# Patient Record
Sex: Female | Born: 1945 | Race: White | Smoking: Former smoker
Health system: Southern US, Community
[De-identification: ages and names within clinical notes are randomized; demographics above are authoritative.]

## PROBLEM LIST (undated history)

## (undated) DIAGNOSIS — Z9289 Personal history of other medical treatment: Secondary | ICD-10-CM

## (undated) DIAGNOSIS — Z9889 Other specified postprocedural states: Secondary | ICD-10-CM

## (undated) DIAGNOSIS — I10 Essential (primary) hypertension: Secondary | ICD-10-CM

## (undated) DIAGNOSIS — M8580 Other specified disorders of bone density and structure, unspecified site: Secondary | ICD-10-CM

## (undated) DIAGNOSIS — H409 Unspecified glaucoma: Secondary | ICD-10-CM

## (undated) HISTORY — DX: Other specified postprocedural states: Z98.890

## (undated) HISTORY — DX: Essential (primary) hypertension: I10

## (undated) HISTORY — PX: OTHER SURGICAL HISTORY: SHX169

## (undated) HISTORY — DX: Unspecified glaucoma: H40.9

## (undated) HISTORY — DX: Other specified disorders of bone density and structure, unspecified site: M85.80

## (undated) HISTORY — DX: Personal history of other medical treatment: Z92.89

## (undated) HISTORY — PX: CATARACT EXTRACTION: SUR2

---

## 2010-10-11 ENCOUNTER — Other Ambulatory Visit: Payer: Self-pay | Admitting: Geriatric Medicine

## 2010-10-11 DIAGNOSIS — Z1239 Encounter for other screening for malignant neoplasm of breast: Secondary | ICD-10-CM

## 2010-10-22 ENCOUNTER — Ambulatory Visit
Admission: RE | Admit: 2010-10-22 | Discharge: 2010-10-22 | Disposition: A | Discharge status: Home / Self Care | Payer: 59 | Source: Ambulatory Visit | Attending: Geriatric Medicine | Admitting: Geriatric Medicine

## 2010-10-22 DIAGNOSIS — Z1239 Encounter for other screening for malignant neoplasm of breast: Secondary | ICD-10-CM

## 2010-11-02 ENCOUNTER — Other Ambulatory Visit: Payer: Self-pay | Admitting: Geriatric Medicine

## 2010-11-02 DIAGNOSIS — R928 Other abnormal and inconclusive findings on diagnostic imaging of breast: Secondary | ICD-10-CM

## 2010-11-09 ENCOUNTER — Ambulatory Visit
Admission: RE | Admit: 2010-11-09 | Discharge: 2010-11-09 | Disposition: A | Discharge status: Home / Self Care | Payer: 59 | Source: Ambulatory Visit | Attending: Geriatric Medicine | Admitting: Geriatric Medicine

## 2010-11-09 DIAGNOSIS — R928 Other abnormal and inconclusive findings on diagnostic imaging of breast: Secondary | ICD-10-CM

## 2011-05-28 ENCOUNTER — Other Ambulatory Visit (HOSPITAL_COMMUNITY)
Admission: RE | Admit: 2011-05-28 | Discharge: 2011-05-28 | Disposition: A | Discharge status: Home / Self Care | Payer: 59 | Source: Ambulatory Visit | Attending: Geriatric Medicine | Admitting: Geriatric Medicine

## 2011-05-28 DIAGNOSIS — Z01419 Encounter for gynecological examination (general) (routine) without abnormal findings: Secondary | ICD-10-CM | POA: Insufficient documentation

## 2011-05-28 DIAGNOSIS — Z1159 Encounter for screening for other viral diseases: Secondary | ICD-10-CM | POA: Insufficient documentation

## 2012-11-11 ENCOUNTER — Other Ambulatory Visit: Payer: Self-pay

## 2012-11-11 DIAGNOSIS — Z1231 Encounter for screening mammogram for malignant neoplasm of breast: Secondary | ICD-10-CM

## 2013-01-01 ENCOUNTER — Ambulatory Visit
Admission: RE | Admit: 2013-01-01 | Discharge: 2013-01-01 | Disposition: A | Discharge status: Home / Self Care | Payer: 59 | Source: Ambulatory Visit

## 2013-01-01 ENCOUNTER — Ambulatory Visit: Payer: Self-pay

## 2013-01-01 DIAGNOSIS — Z1231 Encounter for screening mammogram for malignant neoplasm of breast: Secondary | ICD-10-CM

## 2013-01-05 ENCOUNTER — Other Ambulatory Visit: Payer: Self-pay | Admitting: Geriatric Medicine

## 2013-01-05 DIAGNOSIS — R928 Other abnormal and inconclusive findings on diagnostic imaging of breast: Secondary | ICD-10-CM

## 2013-01-20 ENCOUNTER — Ambulatory Visit
Admission: RE | Admit: 2013-01-20 | Discharge: 2013-01-20 | Disposition: A | Discharge status: Home / Self Care | Payer: 59 | Source: Ambulatory Visit | Attending: Geriatric Medicine | Admitting: Geriatric Medicine

## 2013-01-20 DIAGNOSIS — R928 Other abnormal and inconclusive findings on diagnostic imaging of breast: Secondary | ICD-10-CM

## 2014-07-15 ENCOUNTER — Other Ambulatory Visit: Payer: Self-pay

## 2014-07-15 DIAGNOSIS — Z1231 Encounter for screening mammogram for malignant neoplasm of breast: Secondary | ICD-10-CM

## 2014-09-05 ENCOUNTER — Ambulatory Visit
Admission: RE | Admit: 2014-09-05 | Discharge: 2014-09-05 | Disposition: A | Discharge status: Home / Self Care | Payer: Medicare Other | Source: Ambulatory Visit

## 2014-09-05 DIAGNOSIS — Z1231 Encounter for screening mammogram for malignant neoplasm of breast: Secondary | ICD-10-CM

## 2014-09-08 ENCOUNTER — Other Ambulatory Visit: Payer: Self-pay | Admitting: Geriatric Medicine

## 2014-09-08 DIAGNOSIS — R928 Other abnormal and inconclusive findings on diagnostic imaging of breast: Secondary | ICD-10-CM

## 2014-09-16 ENCOUNTER — Ambulatory Visit
Admission: RE | Admit: 2014-09-16 | Discharge: 2014-09-16 | Disposition: A | Discharge status: Home / Self Care | Payer: Medicare Other | Source: Ambulatory Visit | Attending: Geriatric Medicine | Admitting: Geriatric Medicine

## 2014-09-16 ENCOUNTER — Other Ambulatory Visit: Payer: Self-pay | Admitting: Geriatric Medicine

## 2014-09-16 DIAGNOSIS — R928 Other abnormal and inconclusive findings on diagnostic imaging of breast: Secondary | ICD-10-CM

## 2014-09-22 ENCOUNTER — Other Ambulatory Visit: Payer: Self-pay | Admitting: Geriatric Medicine

## 2014-09-22 DIAGNOSIS — R928 Other abnormal and inconclusive findings on diagnostic imaging of breast: Secondary | ICD-10-CM

## 2014-09-28 ENCOUNTER — Ambulatory Visit
Admission: RE | Admit: 2014-09-28 | Discharge: 2014-09-28 | Disposition: A | Discharge status: Home / Self Care | Payer: Medicare Other | Source: Ambulatory Visit | Attending: Geriatric Medicine | Admitting: Geriatric Medicine

## 2014-09-28 DIAGNOSIS — R928 Other abnormal and inconclusive findings on diagnostic imaging of breast: Secondary | ICD-10-CM

## 2014-09-28 HISTORY — PX: BREAST BIOPSY: SHX20

## 2015-02-13 ENCOUNTER — Other Ambulatory Visit: Payer: Self-pay | Admitting: Physician Assistant

## 2015-02-13 ENCOUNTER — Ambulatory Visit (INDEPENDENT_AMBULATORY_CARE_PROVIDER_SITE_OTHER): Payer: Medicare Other | Admitting: Physician Assistant

## 2015-02-13 DIAGNOSIS — R9439 Abnormal result of other cardiovascular function study: Secondary | ICD-10-CM

## 2015-02-13 DIAGNOSIS — R9431 Abnormal electrocardiogram [ECG] [EKG]: Secondary | ICD-10-CM

## 2015-02-13 DIAGNOSIS — R079 Chest pain, unspecified: Secondary | ICD-10-CM

## 2015-02-13 LAB — EXERCISE TOLERANCE TEST
CHL CUP MPHR: 115 {beats}/min
CSEPEW: 10.1 METS
CSEPPHR: 129 {beats}/min
Exercise duration (min): 9 min
Percent HR: 86 %
RPE: 15
Rest HR: 53 {beats}/min

## 2015-02-13 NOTE — Patient Instructions (Addendum)
Medication Instructions:  Your physician recommends that you continue on your current medications as directed. Please refer to the Current Medication list given to you today.   Labwork: NONE  Testing/Procedures: Your physician has requested that you have en exercise stress myoview. For further information please visit [email or web address]. Please follow instruction sheet, as given.   Follow-Up:   Any Other Special Instructions Will Be Listed Below (If Applicable).

## 2015-02-27 ENCOUNTER — Telehealth (HOSPITAL_COMMUNITY): Payer: Self-pay | Admitting: *Deleted

## 2015-02-27 NOTE — Telephone Encounter (Signed)
Left message on voicemail in reference to upcoming appointment scheduled for 03/01/15. Phone number given for a call back so details instructions can be given. Liala Codispoti J Shaniah Baltes, RN  

## 2015-02-27 NOTE — Telephone Encounter (Signed)
Patient given detailed instructions per Myocardial Perfusion Study Information Sheet for test on 03/01/15 at 9 am. Patient Notified to arrive 15 minutes early, and that it is imperative to arrive on time for appointment to keep from having the test rescheduled. Patient verbalized understanding. Hubbard Robinson, RN

## 2015-03-01 ENCOUNTER — Ambulatory Visit (HOSPITAL_COMMUNITY): Payer: Medicare Other | Attending: Cardiovascular Disease

## 2015-03-01 DIAGNOSIS — R9431 Abnormal electrocardiogram [ECG] [EKG]: Secondary | ICD-10-CM | POA: Diagnosis not present

## 2015-03-01 DIAGNOSIS — R0600 Dyspnea, unspecified: Secondary | ICD-10-CM | POA: Diagnosis not present

## 2015-03-01 DIAGNOSIS — I10 Essential (primary) hypertension: Secondary | ICD-10-CM | POA: Insufficient documentation

## 2015-03-01 DIAGNOSIS — R42 Dizziness and giddiness: Secondary | ICD-10-CM | POA: Diagnosis not present

## 2015-03-01 DIAGNOSIS — R9439 Abnormal result of other cardiovascular function study: Secondary | ICD-10-CM | POA: Diagnosis not present

## 2015-03-01 LAB — MYOCARDIAL PERFUSION IMAGING
CHL CUP NUCLEAR SSS: 4
CHL CUP RESTING HR STRESS: 51 {beats}/min
CHL RATE OF PERCEIVED EXERTION: 18
CSEPHR: 91 %
CSEPPHR: 137 {beats}/min
Estimated workload: 11.7 METS
Exercise duration (min): 10 min
Exercise duration (sec): 0 s
LHR: 0.22
LV dias vol: 78 mL
LVSYSVOL: 29 mL
MPHR: 150 {beats}/min
NUC STRESS TID: 0.96
SDS: 4
SRS: 0

## 2015-03-01 MED ORDER — TECHNETIUM TC 99M SESTAMIBI GENERIC - CARDIOLITE
10.4000 | Freq: Once | INTRAVENOUS | Status: AC | PRN
  Administered 2015-03-01: 10 via INTRAVENOUS

## 2015-03-01 MED ORDER — TECHNETIUM TC 99M SESTAMIBI GENERIC - CARDIOLITE
32.4000 | Freq: Once | INTRAVENOUS | Status: AC | PRN
  Administered 2015-03-01: 32 via INTRAVENOUS

## 2015-03-02 ENCOUNTER — Encounter: Payer: Self-pay | Admitting: Physician Assistant

## 2015-03-03 ENCOUNTER — Telehealth: Payer: Self-pay | Admitting: Physician Assistant

## 2015-03-03 NOTE — Telephone Encounter (Signed)
lmptcb to go over Juliette results

## 2015-03-03 NOTE — Telephone Encounter (Signed)
Pt was advised of abnormal stress test results by Brynda Rim. PA and is agreeable to plan of care for pt to come in next week to discuss further. I scheduled pt 7/5 FLEX day ok per Brynda Rim. PA. Pt agreeable to plan of care.

## 2015-03-03 NOTE — Telephone Encounter (Signed)
New Message   Pt calling to discuss myoview results. Please call back and discuss.

## 2015-03-03 NOTE — Telephone Encounter (Signed)
New problem   Pt returning your call and stated to call her at work [phone] ext [phone]

## 2015-03-07 ENCOUNTER — Ambulatory Visit: Payer: Medicare Other | Admitting: Internal Medicine

## 2015-03-07 ENCOUNTER — Encounter: Payer: Self-pay | Admitting: Physician Assistant

## 2015-03-07 ENCOUNTER — Ambulatory Visit (INDEPENDENT_AMBULATORY_CARE_PROVIDER_SITE_OTHER): Payer: Medicare Other | Admitting: Physician Assistant

## 2015-03-07 ENCOUNTER — Encounter: Payer: Self-pay | Admitting: *Deleted

## 2015-03-07 VITALS — BP 140/75 | HR 52 | Ht 63.5 in | Wt 138.0 lb

## 2015-03-07 DIAGNOSIS — Z01812 Encounter for preprocedural laboratory examination: Secondary | ICD-10-CM

## 2015-03-07 DIAGNOSIS — R011 Cardiac murmur, unspecified: Secondary | ICD-10-CM | POA: Diagnosis not present

## 2015-03-07 DIAGNOSIS — I10 Essential (primary) hypertension: Secondary | ICD-10-CM

## 2015-03-07 DIAGNOSIS — R9439 Abnormal result of other cardiovascular function study: Secondary | ICD-10-CM

## 2015-03-07 MED ORDER — NITROGLYCERIN 0.4 MG SL SUBL: 0.4000 mg | SUBLINGUAL_TABLET | SUBLINGUAL | Status: AC | PRN

## 2015-03-07 MED ORDER — ASPIRIN EC 81 MG PO TBEC: 81.0000 mg | DELAYED_RELEASE_TABLET | Freq: Every day | ORAL | Status: AC

## 2015-03-07 NOTE — Patient Instructions (Addendum)
Medication Instructions:  1) START taking Aspirin 81mg  once daily 2) The proper use and anticipated side effects of nitroglycerine has been carefully explained.  If a single episode of chest pain is not relieved by one tablet, the patient will try another within 5 minutes; and if this doesn't relieve the pain, the patient is instructed to call 911 for transportation to an emergency department.  Labwork: CBC, BMET, INR today  Testing/Procedures:  Your physician has requested that you have an echocardiogram. Echocardiography is a painless test that uses sound waves to create images of your heart. It provides your doctor with information about the size and shape of your heart and how well your heart's chambers and valves are working. This procedure takes approximately one hour. There are no restrictions for this procedure.   Your physician has requested that you have a cardiac catheterization. Cardiac catheterization is used to diagnose and/or treat various heart conditions. Doctors may recommend this procedure for a number of different reasons. The most common reason is to evaluate chest pain. Chest pain can be a symptom of coronary artery disease (CAD), and cardiac catheterization can show whether plaque is narrowing or blocking your heart's arteries. This procedure is also used to evaluate the valves, as well as measure the blood flow and oxygen levels in different parts of your heart. For further information please visit [email or web address]. Please follow instruction sheet, as given.   Follow-Up: With Richardson Dopp, PA after catheterization.  Any Other Special Instructions Will Be Listed Below (If Applicable).

## 2015-03-07 NOTE — Progress Notes (Signed)
Cardiology Office Note   Date:  03/07/2015   ID:  Amber Barr, DOB 09-11-1945, MRN [id number]  PCP:  Mathews Argyle, MD  Cardiologist:  New - Dr. Cristopher Peru     Chief Complaint  Patient presents with  . Abnormal Stress Test     History of Present Illness: Amber Barr is a 69 y.o. post-menopausal female with a hx of HTN, glaucoma, osteopenia.  She was recently referred by her PCP for an exercise treadmill test. This demonstrated inferolateral ST segment depression. Myoview was arranged. This was performed 03/01/15 and was abnormal with evidence of apical anterior as well as inferolateral and basal anterolateral ischemia. EF is 63%. Study was felt to be intermediate risk. She was therefore scheduled for further evaluation.  She was referred b/c of a hx of interscapular back pain while hiking.  She had another episode while at rest several days later.  This occurred in May. She had PVCs in a trigeminal pattern by ECG and was referred for the ETT.  She does not have a hx of exertional chest pain.  No significant DOE.  No syncope.  No orthopnea, PND, edema.  She went fly fishing this past weekend in [place], [place].  She had no issues while she was on her trip.     Studies/Reports Reviewed Today:  Myoview 03/01/15  Nuclear stress EF: 63%.  Upsloping ST segment depression ST segment depression of 1.5 mm was noted during stress in the inferolateral leads (II, III, aVF, V5 and V6), and returning to baseline after less than 1 minute of recovery.  Defect 1: There is a medium defect of mild severity present in the basal inferolateral, basal anterolateral and mid inferolateral location.  Defect 2: There is a small defect of mild severity present in the apical anterior location.  Findings consistent with ischemia.  This is an intermediate risk study.  The left ventricular ejection fraction is normal (55-65%). There are two defects:  1. A small, mild reversible defect in the  apical anterior wall consistent with ischemia in the distal LAD territory.  2. A medium size, mild reversible defect in the basal and mid inferolateral and basal anterolateral walls consistent with ischemia in the LCX territory.  Overall SDS is 4.   Nuclear stress test 07/2001 ([facility] in [place]): Small reversible anterior defect consistent with ischemia  LHC 07/2001 ([place]): Angiographically normal coronary arteries with minimal coronary calcification   Past Medical History  Diagnosis Date  . History of cardiovascular stress test     Myoview 6/16:  EF 63%, inf-lateral, ant-lateral, apical ischemia; Intermediate Risk  . S/P cardiac cath     a. LHC in [place] in 07/2001:  normal cors  . Osteopenia   . HTN (hypertension)   . Glaucoma     Past Surgical History  Procedure Laterality Date  . Left salpingo-oophorectomy    . Cataract extraction    . Breast biopsy Left      Current Outpatient Prescriptions  Medication Sig Dispense Refill  . DUREZOL 0.05 % EMUL INSTILL 1 DROP INTO RIGHT EYE 4 TIMES A DAY *USE AS DIRECTED AFTER SURGERY*  0  . estradiol (CLIMARA - DOSED IN MG/24 HR) 0.025 mg/24hr patch APPLY 1 PATCH SKIN EVERY WEEK  3  . fluconazole (DIFLUCAN) 100 MG tablet Take 100 mg by mouth once.   3  . ibandronate (BONIVA) 150 MG tablet Take 150 mg by mouth every 30 (thirty) days.   0  .  ketoconazole (NIZORAL) 2 % cream APPLY TO AFFECTED AREA TWICE A DAY  0  . medroxyPROGESTERone (PROVERA) 5 MG tablet TAKE 1 TABLET BY MOUTH EVERY DAY FOR 10 DAYS OF EACH MONTH  3  . ofloxacin (OCUFLOX) 0.3 % ophthalmic solution PLACE 1 DROP INTO RIGHT EYE 4 TIMES A DAY STARTING 2 DAYS PRIOR TO SURGERY  0  . TRAVATAN Z 0.004 % SOLN ophthalmic solution INSTILL ONE DROP INTO EACH EYE ONCE DAILY AT BEDTIME  11  . valACYclovir (VALTREX) 500 MG tablet Take 500 mg by mouth daily.   0  . valsartan (DIOVAN) 160 MG tablet Take 160 mg by mouth daily.   2  .  aspirin EC 81 MG tablet Take 1 tablet (81 mg total) by mouth daily.    . nitroGLYCERIN (NITROSTAT) 0.4 MG SL tablet Place 1 tablet (0.4 mg total) under the tongue every 5 (five) minutes as needed for chest pain (for three doses.). 25 tablet 5   No current facility-administered medications for this visit.    Allergies:   Boniva; Codeine; Lexapro; and Sulfa antibiotics    Social History:  The patient  reports that she has quit smoking. She does not have any smokeless tobacco history on file. She reports that she drinks about 1.2 oz of alcohol per week. She reports that she does not use illicit drugs.   Family History:  The patient's family history includes Heart failure in her mother; Hypertension in her mother and sister. There is no history of Stroke or Heart attack.    ROS:   Please see the history of present illness.   Review of Systems  All other systems reviewed and are negative.    PHYSICAL EXAM: VS:  BP 140/75 mmHg  Pulse 52  Ht 5' 3.5" (1.613 m)  Wt 138 lb (62.596 kg)  BMI 24.06 kg/m2    Wt Readings from Last 3 Encounters:  03/07/15 138 lb (62.596 kg)  03/01/15 137 lb (62.143 kg)     GEN: Well nourished, well developed, in no acute distress HEENT: normal Neck: no JVD, no carotid bruits, no masses Cardiac:  Normal S1/S2, RRR; 8-8/4 sysolic murmur RUSB,  no rubs or gallops, no edema   Respiratory:  clear to auscultation bilaterally, no wheezing, rhonchi or rales. GI: soft, nontender, nondistended, + BS MS: no deformity or atrophy Skin: warm and dry  Neuro:  CNs II-XII intact, Strength and sensation are intact Psych: Normal affect   EKG:  EKG is ordered today.  It demonstrates:   Sinus bradycardia, unusual P axis, HR 52, nonspecific ST-T wave changes   Recent Labs: No results found for requested labs within last 365 days.    Lipid Panel No results found for: CHOL, TRIG, HDL, CHOLHDL, VLDL, LDLCALC, LDLDIRECT 07/12/2014:  TC 197, HDL 102, LDL 73   ASSESSMENT  AND PLAN:  Abnormal finding on cardiovascular stress test:  She had a stress test 14 years ago that was abnormal.  However, her LHC was normal.  She did have an episode of interscapular back pain prior to getting set up for her ETT.  We discussed proceeding with cardiac cath vs cardiac CTA.  I reviewed this all with Dr. Cristopher Peru who also saw the patient.  We have ultimately recommended proceeding with cardiac cath.  Risks and benefits of cardiac catheterization have been discussed with the patient.  These include bleeding, infection, kidney damage, stroke, heart attack, death.  The patient understands these risks and is willing to proceed. Continue  ASA.  Will give NTG to take prn.    Essential hypertension:  Fair control.  Continue to monitor.  Continue angiotensin receptor blocker.   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  Check Echo.   Medication Changes: Current medicines are reviewed at length with the patient today.  Concerns regarding medicines are as outlined above.  The following changes have been made:   Discontinued Medications   No medications on file   Modified Medications   No medications on file   New Prescriptions   ASPIRIN EC 81 MG TABLET    Take 1 tablet (81 mg total) by mouth daily.   NITROGLYCERIN (NITROSTAT) 0.4 MG SL TABLET    Place 1 tablet (0.4 mg total) under the tongue every 5 (five) minutes as needed for chest pain (for three doses.).     Labs/ tests ordered today include:   Orders Placed This Encounter  Procedures  . CBC  . Basic Metabolic Panel (BMET)  . INR/PT  . EKG 12-Lead  . Echocardiogram     Disposition:   FU with me after cardiac cath.    Signed, Versie Starks, MHS 03/07/2015 5:05 PM    [facility] [place], [place], [place]  [address] Phone: [phone]; Fax: [phone]     Cardiology Attending  Patient Seen and reviewed with Mr. Kathlen Mody. She has exertional angina and a positive stress and nuclear  test. Agree with plans as outlined by Mr. Kathlen Mody to proceed with left heart cathterization.  Mikle Bosworth.D.

## 2015-03-08 ENCOUNTER — Telehealth: Payer: Self-pay | Admitting: *Deleted

## 2015-03-08 LAB — BASIC METABOLIC PANEL
BUN: 16 mg/dL (ref 6–23)
CO2: 28 mEq/L (ref 19–32)
CREATININE: 0.71 mg/dL (ref 0.40–1.20)
Calcium: 8.9 mg/dL (ref 8.4–10.5)
Chloride: 100 mEq/L (ref 96–112)
GFR: 86.4 mL/min (ref >60.00)
GLUCOSE: 86 mg/dL (ref 70–99)
Potassium: 4.3 mEq/L (ref 3.5–5.1)
Sodium: 134 mEq/L — ABNORMAL LOW (ref 135–145)

## 2015-03-08 LAB — CBC
HEMATOCRIT: 37.2 % (ref 36.0–46.0)
HEMOGLOBIN: 12.3 g/dL (ref 12.0–15.0)
MCHC: 33.1 g/dL (ref 30.0–36.0)
MCV: 95.9 fl (ref 78.0–100.0)
PLATELETS: 209 10*3/uL (ref 150.0–400.0)
RBC: 3.88 Mil/uL (ref 3.87–5.11)
RDW: 13.8 % (ref 11.5–15.5)
WBC: 7.4 10*3/uL (ref 4.0–10.5)

## 2015-03-08 LAB — PROTIME-INR
INR: 1 ratio (ref 0.8–1.0)
PROTHROMBIN TIME: 11.3 s (ref 9.6–13.1)

## 2015-03-08 NOTE — Telephone Encounter (Signed)
Pt notified of lab results ok for cath on 03/10/15; pt asked  if her f/u appt could be moved up to a sooner date from 77/10; I then rescheduled pt for 04/02/09 @ 12:10. Pt agreeable to plan of care.

## 2015-03-09 ENCOUNTER — Ambulatory Visit (HOSPITAL_COMMUNITY)
Admission: RE | Admit: 2015-03-09 | Discharge: 2015-03-09 | Disposition: A | Discharge status: Home / Self Care | Payer: Medicare Other | Source: Ambulatory Visit | Attending: Cardiology | Admitting: Cardiology

## 2015-03-09 ENCOUNTER — Encounter: Payer: Self-pay | Admitting: Physician Assistant

## 2015-03-09 DIAGNOSIS — I10 Essential (primary) hypertension: Secondary | ICD-10-CM | POA: Insufficient documentation

## 2015-03-09 DIAGNOSIS — R011 Cardiac murmur, unspecified: Secondary | ICD-10-CM

## 2015-03-09 DIAGNOSIS — I340 Nonrheumatic mitral (valve) insufficiency: Secondary | ICD-10-CM | POA: Insufficient documentation

## 2015-03-10 ENCOUNTER — Encounter (HOSPITAL_COMMUNITY)
Admission: RE | Disposition: A | Discharge status: Home / Self Care | Payer: Medicare Other | Source: Ambulatory Visit | Attending: Cardiovascular Disease

## 2015-03-10 ENCOUNTER — Telehealth: Payer: Self-pay | Admitting: *Deleted

## 2015-03-10 ENCOUNTER — Ambulatory Visit (HOSPITAL_COMMUNITY)
Admission: RE | Admit: 2015-03-10 | Discharge: 2015-03-10 | Disposition: A | Discharge status: Home / Self Care | Payer: Medicare Other | Source: Ambulatory Visit | Attending: Cardiovascular Disease | Admitting: Cardiovascular Disease

## 2015-03-10 DIAGNOSIS — R9439 Abnormal result of other cardiovascular function study: Secondary | ICD-10-CM

## 2015-03-10 DIAGNOSIS — Z7982 Long term (current) use of aspirin: Secondary | ICD-10-CM | POA: Diagnosis not present

## 2015-03-10 DIAGNOSIS — M8580 Other specified disorders of bone density and structure, unspecified site: Secondary | ICD-10-CM | POA: Diagnosis not present

## 2015-03-10 DIAGNOSIS — I2510 Atherosclerotic heart disease of native coronary artery without angina pectoris: Secondary | ICD-10-CM | POA: Diagnosis not present

## 2015-03-10 DIAGNOSIS — I10 Essential (primary) hypertension: Secondary | ICD-10-CM | POA: Diagnosis not present

## 2015-03-10 DIAGNOSIS — R931 Abnormal findings on diagnostic imaging of heart and coronary circulation: Secondary | ICD-10-CM | POA: Diagnosis present

## 2015-03-10 DIAGNOSIS — H409 Unspecified glaucoma: Secondary | ICD-10-CM | POA: Insufficient documentation

## 2015-03-10 DIAGNOSIS — R0789 Other chest pain: Secondary | ICD-10-CM | POA: Diagnosis not present

## 2015-03-10 HISTORY — PX: CARDIAC CATHETERIZATION: SHX172

## 2015-03-10 SURGERY — LEFT HEART CATH AND CORONARY ANGIOGRAPHY
Anesthesia: LOCAL

## 2015-03-10 MED ORDER — HEPARIN SODIUM (PORCINE) 1000 UNIT/ML IJ SOLN
INTRAMUSCULAR | Status: DC | PRN
  Administered 2015-03-10: 3000 [IU] via INTRAVENOUS

## 2015-03-10 MED ORDER — MIDAZOLAM HCL 2 MG/2ML IJ SOLN
INTRAMUSCULAR | Status: DC | PRN
  Administered 2015-03-10: 1 mg via INTRAVENOUS
  Administered 2015-03-10: 2 mg via INTRAVENOUS

## 2015-03-10 MED ORDER — FENTANYL CITRATE (PF) 100 MCG/2ML IJ SOLN
INTRAMUSCULAR | Status: AC
  Filled 2015-03-10: qty 2

## 2015-03-10 MED ORDER — SODIUM CHLORIDE 0.9 % IJ SOLN: 3.0000 mL | Freq: Two times a day (BID) | INTRAMUSCULAR | Status: DC

## 2015-03-10 MED ORDER — SODIUM CHLORIDE 0.9 % IV SOLN
INTRAVENOUS | Status: DC
  Administered 2015-03-10: 06:00:00 via INTRAVENOUS

## 2015-03-10 MED ORDER — MIDAZOLAM HCL 2 MG/2ML IJ SOLN
INTRAMUSCULAR | Status: AC
  Filled 2015-03-10: qty 2

## 2015-03-10 MED ORDER — ASPIRIN 81 MG PO CHEW: 81.0000 mg | CHEWABLE_TABLET | ORAL | Status: DC

## 2015-03-10 MED ORDER — NITROGLYCERIN 1 MG/10 ML FOR IR/CATH LAB
INTRA_ARTERIAL | Status: AC
  Filled 2015-03-10: qty 10

## 2015-03-10 MED ORDER — SODIUM CHLORIDE 0.9 % IV SOLN: INTRAVENOUS | Status: DC

## 2015-03-10 MED ORDER — LIDOCAINE HCL (PF) 1 % IJ SOLN
INTRAMUSCULAR | Status: AC
  Filled 2015-03-10: qty 30

## 2015-03-10 MED ORDER — FENTANYL CITRATE (PF) 100 MCG/2ML IJ SOLN
INTRAMUSCULAR | Status: DC | PRN
  Administered 2015-03-10 (×2): 25 ug via INTRAVENOUS

## 2015-03-10 MED ORDER — HEPARIN SODIUM (PORCINE) 1000 UNIT/ML IJ SOLN
INTRAMUSCULAR | Status: AC
  Filled 2015-03-10: qty 1

## 2015-03-10 MED ORDER — SODIUM CHLORIDE 0.9 % IV SOLN: 250.0000 mL | INTRAVENOUS | Status: DC | PRN

## 2015-03-10 MED ORDER — ONDANSETRON HCL 4 MG/2ML IJ SOLN: 4.0000 mg | Freq: Four times a day (QID) | INTRAMUSCULAR | Status: DC | PRN

## 2015-03-10 MED ORDER — SODIUM CHLORIDE 0.9 % IJ SOLN
INTRAMUSCULAR | Status: DC | PRN
  Administered 2015-03-10: 09:00:00 via INTRA_ARTERIAL

## 2015-03-10 MED ORDER — LIDOCAINE HCL (PF) 1 % IJ SOLN
INTRAMUSCULAR | Status: DC | PRN
  Administered 2015-03-10: 09:00:00

## 2015-03-10 MED ORDER — VERAPAMIL HCL 2.5 MG/ML IV SOLN
INTRAVENOUS | Status: AC
  Filled 2015-03-10: qty 2

## 2015-03-10 MED ORDER — SODIUM CHLORIDE 0.9 % IJ SOLN: 3.0000 mL | INTRAMUSCULAR | Status: DC | PRN

## 2015-03-10 MED ORDER — IOHEXOL 350 MG/ML SOLN
INTRAVENOUS | Status: DC | PRN
  Administered 2015-03-10: 60 mL via INTRAVENOUS

## 2015-03-10 MED ORDER — HEPARIN (PORCINE) IN NACL 2-0.9 UNIT/ML-% IJ SOLN
INTRAMUSCULAR | Status: AC
  Filled 2015-03-10: qty 1500

## 2015-03-10 MED ORDER — ACETAMINOPHEN 325 MG PO TABS: 650.0000 mg | ORAL_TABLET | ORAL | Status: DC | PRN

## 2015-03-10 SURGICAL SUPPLY — 15 items
CATH INFINITI 5 FR JL3.5 (CATHETERS) ×2 IMPLANT
CATH INFINITI 5FR ANG PIGTAIL (CATHETERS) ×2 IMPLANT
CATH INFINITI 5FR MULTPACK ANG (CATHETERS) IMPLANT
CATH INFINITI JR4 5F (CATHETERS) ×2 IMPLANT
DEVICE RAD COMP TR BAND LRG (VASCULAR PRODUCTS) ×2 IMPLANT
GLIDESHEATH SLEND SS 6F .021 (SHEATH) ×2 IMPLANT
KIT HEART LEFT (KITS) ×2 IMPLANT
PACK CARDIAC CATHETERIZATION (CUSTOM PROCEDURE TRAY) ×2 IMPLANT
SHEATH PINNACLE 5F 10CM (SHEATH) IMPLANT
SYR MEDRAD MARK V 150ML (SYRINGE) ×2 IMPLANT
TRANSDUCER W/STOPCOCK (MISCELLANEOUS) ×2 IMPLANT
TUBING CIL FLEX 10 FLL-RA (TUBING) ×2 IMPLANT
WIRE EMERALD 3MM-J .035X150CM (WIRE) IMPLANT
WIRE HI TORQ VERSACORE-J 145CM (WIRE) ×2 IMPLANT
WIRE SAFE-T 1.5MM-J .035X260CM (WIRE) ×4 IMPLANT

## 2015-03-10 NOTE — Discharge Instructions (Signed)
Radial Site Care °Refer to this sheet in the next few weeks. These instructions provide you with information on caring for yourself after your procedure. Your caregiver may also give you more specific instructions. Your treatment has been planned according to current medical practices, but problems sometimes occur. Call your caregiver if you have any problems or questions after your procedure. °HOME CARE INSTRUCTIONS °· You may shower the day after the procedure. Remove the bandage (dressing) and gently wash the site with plain soap and water. Gently pat the site dry. °· Do not apply powder or lotion to the site. °· Do not submerge the affected site in water for 3 to 5 days. °· Inspect the site at least twice daily. °· Do not flex or bend the affected arm for 24 hours. °· No lifting over 5 pounds (2.3 kg) for 5 days after your procedure. °· Do not drive home if you are discharged the same day of the procedure. Have someone else drive you. °· You may drive 24 hours after the procedure unless otherwise instructed by your caregiver. °· Do not operate machinery or power tools for 24 hours. °· A responsible adult should be with you for the first 24 hours after you arrive home. °What to expect: °· Any bruising will usually fade within 1 to 2 weeks. °· Blood that collects in the tissue (hematoma) may be painful to the touch. It should usually decrease in size and tenderness within 1 to 2 weeks. °SEEK IMMEDIATE MEDICAL CARE IF: °· You have unusual pain at the radial site. °· You have redness, warmth, swelling, or pain at the radial site. °· You have drainage (other than a small amount of blood on the dressing). °· You have chills. °· You have a fever or persistent symptoms for more than 72 hours. °· You have a fever and your symptoms suddenly get worse. °· Your arm becomes pale, cool, tingly, or numb. °· You have heavy bleeding from the site. Hold pressure on the site. °Document Released: 09/21/2010 Document Revised:  11/11/2011 Document Reviewed: 09/21/2010 °ExitCare® Patient Information ©2015 [employer]. This information is not intended to replace advice given to you by your health care provider. Make sure you discuss any questions you have with your health care provider. ° °

## 2015-03-10 NOTE — Research (Signed)
CADLAB Informed Consent   Subject Name: Amber Barr  Subject met inclusion and exclusion criteria.  The informed consent form, study requirements and expectations were reviewed with the subject and questions and concerns were addressed prior to the signing of the consent form.  The subject verbalized understanding of the trail requirements.  The subject agreed to participate in the CADLAB trial and signed the informed consent.  The informed consent was obtained prior to performance of any protocol-specific procedures for the subject.  A copy of the signed informed consent was given to the subject and a copy was placed in the subject's medical record.  Sandie Ano 03/10/2015, 7:00

## 2015-03-10 NOTE — Telephone Encounter (Signed)
Pt notified of echo results with verbal understanding to by phone to results given.

## 2015-03-10 NOTE — Interval H&P Note (Signed)
History and Physical Interval Note:  03/10/2015 8:33 AM  Georgana Curio  has presented today for surgery, with the diagnosis of abnormal stress test  The various methods of treatment have been discussed with the patient and family. After consideration of risks, benefits and other options for treatment, the patient has consented to  Procedure(s): Left Heart Cath and Coronary Angiography (N/A) as a surgical intervention .  The patient's history has been reviewed, patient examined, no change in status, stable for surgery.  I have reviewed the patient's chart and labs.  Questions were answered to the patient's satisfaction.    Cath Lab Visit (complete for each Cath Lab visit)  Clinical Evaluation Leading to the Procedure:   ACS: No.  Non-ACS:    Anginal Classification: CCS II  Anti-ischemic medical therapy: No Therapy  Non-Invasive Test Results: Intermediate-risk stress test findings: cardiac mortality 1-3%/year  Prior CABG: No previous CABG       Sherren Mocha

## 2015-03-10 NOTE — H&P (View-Only) (Signed)
Cardiology Office Note   Date:  03/07/2015   ID:  Amber Barr, DOB February 03, 1946, MRN [id number]  PCP:  Mathews Argyle, MD  Cardiologist:  New - Dr. Cristopher Peru     Chief Complaint  Patient presents with  . Abnormal Stress Test     History of Present Illness: Amber Barr is a 69 y.o. post-menopausal female with a hx of HTN, glaucoma, osteopenia.  She was recently referred by her PCP for an exercise treadmill test. This demonstrated inferolateral ST segment depression. Myoview was arranged. This was performed 03/01/15 and was abnormal with evidence of apical anterior as well as inferolateral and basal anterolateral ischemia. EF is 63%. Study was felt to be intermediate risk. She was therefore scheduled for further evaluation.  She was referred b/c of a hx of interscapular back pain while hiking.  She had another episode while at rest several days later.  This occurred in May. She had PVCs in a trigeminal pattern by ECG and was referred for the ETT.  She does not have a hx of exertional chest pain.  No significant DOE.  No syncope.  No orthopnea, PND, edema.  She went fly fishing this past weekend in [place], [place].  She had no issues while she was on her trip.     Studies/Reports Reviewed Today:  Myoview 03/01/15  Nuclear stress EF: 63%.  Upsloping ST segment depression ST segment depression of 1.5 mm was noted during stress in the inferolateral leads (II, III, aVF, V5 and V6), and returning to baseline after less than 1 minute of recovery.  Defect 1: There is a medium defect of mild severity present in the basal inferolateral, basal anterolateral and mid inferolateral location.  Defect 2: There is a small defect of mild severity present in the apical anterior location.  Findings consistent with ischemia.  This is an intermediate risk study.  The left ventricular ejection fraction is normal (55-65%). There are two defects:  1. A small, mild reversible defect in the  apical anterior wall consistent with ischemia in the distal LAD territory.  2. A medium size, mild reversible defect in the basal and mid inferolateral and basal anterolateral walls consistent with ischemia in the LCX territory.  Overall SDS is 4.   Nuclear stress test 07/2001 ([facility] in [place]): Small reversible anterior defect consistent with ischemia  LHC 07/2001 ([place]): Angiographically normal coronary arteries with minimal coronary calcification   Past Medical History  Diagnosis Date  . History of cardiovascular stress test     Myoview 6/16:  EF 63%, inf-lateral, ant-lateral, apical ischemia; Intermediate Risk  . S/P cardiac cath     a. LHC in [place] in 07/2001:  normal cors  . Osteopenia   . HTN (hypertension)   . Glaucoma     Past Surgical History  Procedure Laterality Date  . Left salpingo-oophorectomy    . Cataract extraction    . Breast biopsy Left      Current Outpatient Prescriptions  Medication Sig Dispense Refill  . DUREZOL 0.05 % EMUL INSTILL 1 DROP INTO RIGHT EYE 4 TIMES A DAY *USE AS DIRECTED AFTER SURGERY*  0  . estradiol (CLIMARA - DOSED IN MG/24 HR) 0.025 mg/24hr patch APPLY 1 PATCH SKIN EVERY WEEK  3  . fluconazole (DIFLUCAN) 100 MG tablet Take 100 mg by mouth once.   3  . ibandronate (BONIVA) 150 MG tablet Take 150 mg by mouth every 30 (thirty) days.   0  .  ketoconazole (NIZORAL) 2 % cream APPLY TO AFFECTED AREA TWICE A DAY  0  . medroxyPROGESTERone (PROVERA) 5 MG tablet TAKE 1 TABLET BY MOUTH EVERY DAY FOR 10 DAYS OF EACH MONTH  3  . ofloxacin (OCUFLOX) 0.3 % ophthalmic solution PLACE 1 DROP INTO RIGHT EYE 4 TIMES A DAY STARTING 2 DAYS PRIOR TO SURGERY  0  . TRAVATAN Z 0.004 % SOLN ophthalmic solution INSTILL ONE DROP INTO EACH EYE ONCE DAILY AT BEDTIME  11  . valACYclovir (VALTREX) 500 MG tablet Take 500 mg by mouth daily.   0  . valsartan (DIOVAN) 160 MG tablet Take 160 mg by mouth daily.   2  .  aspirin EC 81 MG tablet Take 1 tablet (81 mg total) by mouth daily.    . nitroGLYCERIN (NITROSTAT) 0.4 MG SL tablet Place 1 tablet (0.4 mg total) under the tongue every 5 (five) minutes as needed for chest pain (for three doses.). 25 tablet 5   No current facility-administered medications for this visit.    Allergies:   Boniva; Codeine; Lexapro; and Sulfa antibiotics    Social History:  The patient  reports that she has quit smoking. She does not have any smokeless tobacco history on file. She reports that she drinks about 1.2 oz of alcohol per week. She reports that she does not use illicit drugs.   Family History:  The patient's family history includes Heart failure in her mother; Hypertension in her mother and sister. There is no history of Stroke or Heart attack.    ROS:   Please see the history of present illness.   Review of Systems  All other systems reviewed and are negative.    PHYSICAL EXAM: VS:  BP 140/75 mmHg  Pulse 52  Ht 5' 3.5" (1.613 m)  Wt 138 lb (62.596 kg)  BMI 24.06 kg/m2    Wt Readings from Last 3 Encounters:  03/07/15 138 lb (62.596 kg)  03/01/15 137 lb (62.143 kg)     GEN: Well nourished, well developed, in no acute distress HEENT: normal Neck: no JVD, no carotid bruits, no masses Cardiac:  Normal S1/S2, RRR; 5-7/3 sysolic murmur RUSB,  no rubs or gallops, no edema   Respiratory:  clear to auscultation bilaterally, no wheezing, rhonchi or rales. GI: soft, nontender, nondistended, + BS MS: no deformity or atrophy Skin: warm and dry  Neuro:  CNs II-XII intact, Strength and sensation are intact Psych: Normal affect   EKG:  EKG is ordered today.  It demonstrates:   Sinus bradycardia, unusual P axis, HR 52, nonspecific ST-T wave changes   Recent Labs: No results found for requested labs within last 365 days.    Lipid Panel No results found for: CHOL, TRIG, HDL, CHOLHDL, VLDL, LDLCALC, LDLDIRECT 07/12/2014:  TC 197, HDL 102, LDL 73   ASSESSMENT  AND PLAN:  Abnormal finding on cardiovascular stress test:  She had a stress test 14 years ago that was abnormal.  However, her LHC was normal.  She did have an episode of interscapular back pain prior to getting set up for her ETT.  We discussed proceeding with cardiac cath vs cardiac CTA.  I reviewed this all with Dr. Cristopher Peru who also saw the patient.  We have ultimately recommended proceeding with cardiac cath.  Risks and benefits of cardiac catheterization have been discussed with the patient.  These include bleeding, infection, kidney damage, stroke, heart attack, death.  The patient understands these risks and is willing to proceed. Continue  ASA.  Will give NTG to take prn.    Essential hypertension:  Fair control.  Continue to monitor.  Continue angiotensin receptor blocker.   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  Check Echo.   Medication Changes: Current medicines are reviewed at length with the patient today.  Concerns regarding medicines are as outlined above.  The following changes have been made:   Discontinued Medications   No medications on file   Modified Medications   No medications on file   New Prescriptions   ASPIRIN EC 81 MG TABLET    Take 1 tablet (81 mg total) by mouth daily.   NITROGLYCERIN (NITROSTAT) 0.4 MG SL TABLET    Place 1 tablet (0.4 mg total) under the tongue every 5 (five) minutes as needed for chest pain (for three doses.).     Labs/ tests ordered today include:   Orders Placed This Encounter  Procedures  . CBC  . Basic Metabolic Panel (BMET)  . INR/PT  . EKG 12-Lead  . Echocardiogram     Disposition:   FU with me after cardiac cath.    Signed, Versie Starks, MHS 03/07/2015 5:05 PM    [facility] [place], [place], [place]  [address] Phone: [phone]; Fax: [phone]     Cardiology Attending  Patient Seen and reviewed with Mr. Kathlen Mody. She has exertional angina and a positive stress and nuclear  test. Agree with plans as outlined by Mr. Kathlen Mody to proceed with left heart cathterization.  Mikle Bosworth.D.

## 2015-03-13 ENCOUNTER — Encounter (HOSPITAL_COMMUNITY): Payer: Self-pay | Admitting: Cardiovascular Disease

## 2015-03-13 MED FILL — Nitroglycerin IV Soln 100 MCG/ML in D5W: INTRA_ARTERIAL | Qty: 10 | Status: AC

## 2015-03-16 ENCOUNTER — Encounter: Payer: Self-pay | Admitting: Physician Assistant

## 2015-03-31 ENCOUNTER — Ambulatory Visit: Payer: Self-pay | Admitting: Physician Assistant

## 2015-04-02 NOTE — Progress Notes (Signed)
Cardiology Office Note   Date:  04/03/2015   ID:  Amber Barr, DOB Nov 22, 1945, MRN [id number]  PCP:  Mathews Argyle, MD  Cardiologist:  New - Dr. Cristopher Peru     Chief Complaint  Patient presents with  . Follow-up    s/p cardiac cath     History of Present Illness: Amber Barr is a 69 y.o. post-menopausal female with a hx of HTN, glaucoma, osteopenia.  She was recently referred by her PCP for an exercise treadmill test. This demonstrated inferolateral ST segment depression. Myoview was arranged. This was performed 03/01/15 and was abnormal with evidence of apical anterior as well as inferolateral and basal anterolateral ischemia. EF is 63%. Study was felt to be intermediate risk. She was therefore scheduled for LHC.  LHC demonstrated mild plaquing in the RCA and no significant CAD in the LCx and LAD.  Echo 03/09/15 demonstrated EF 55-60% and no significant valvular abnormalities.    She returns for FU.  She is doing well since her LHC.  The patient denies chest pain, shortness of breath, syncope, orthopnea, PND or significant pedal edema.    Studies/Reports Reviewed Today:  Echo 03/09/15 - EF 55%to 60%. Wall motion was normal. Left ventricular diastolic functionparameters were normal. - Mitral valve: There was mild regurgitation.  LHC 03/10/15 Final conclusions: 1. minimal nonobstructive coronary artery disease with mild plaquing in the proximal RCA without associated stenosis 2. Widely patent left main, LAD, and left circumflex without angiographically significant coronary disease 3. Normal LV function by echo Suspect noncardiac chest pain and false positive stress Myoview  Myoview 03/01/15  Nuclear stress EF: 63%.  Upsloping ST segment depression ST segment depression of 1.5 mm was noted during stress in the inferolateral leads (II, III, aVF, V5 and V6), and returning to baseline after less than 1 minute of recovery.  Defect 1: There is a medium defect of mild  severity present in the basal inferolateral, basal anterolateral and mid inferolateral location.  Defect 2: There is a small defect of mild severity present in the apical anterior location.  Findings consistent with ischemia.  This is an intermediate risk study.  The left ventricular ejection fraction is normal (55-65%). There are two defects:  1. A small, mild reversible defect in the apical anterior wall consistent with ischemia in the distal LAD territory.  2. A medium size, mild reversible defect in the basal and mid inferolateral and basal anterolateral walls consistent with ischemia in the LCX territory.  Overall SDS is 4.   Nuclear stress test 07/2001 ([facility] in [place]): Small reversible anterior defect consistent with ischemia  LHC 07/2001 ([facility]): Angiographically normal coronary arteries with minimal coronary calcification   Past Medical History  Diagnosis Date  . History of cardiovascular stress test     Myoview 6/16:  EF 63%, inf-lateral, ant-lateral, apical ischemia; Intermediate Risk  . S/P cardiac cath     a. LHC in [place] in 07/2001:  normal cors  . Osteopenia   . HTN (hypertension)   . Glaucoma   . History of echocardiogram     Echo 7/16:  EF 55-60%, normal WM, mild MR    Past Surgical History  Procedure Laterality Date  . Left salpingo-oophorectomy    . Cataract extraction    . Breast biopsy Left   . Cardiac catheterization N/A 03/10/2015    Procedure: Left Heart Cath and Coronary Angiography;  Surgeon: Sherren Mocha, MD;  Location: [facility];  Service:  Cardiovascular;  Laterality: N/A;     Current Outpatient Prescriptions  Medication Sig Dispense Refill  . aspirin EC 81 MG tablet Take 1 tablet (81 mg total) by mouth daily.    . DUREZOL 0.05 % EMUL INSTILL 1 DROP INTO RIGHT EYE 4 TIMES A DAY *USE AS DIRECTED AFTER SURGERY*  0  . estradiol (CLIMARA - DOSED IN MG/24 HR) 0.025 mg/24hr patch APPLY 1  PATCH SKIN EVERY WEEK  3  . fluconazole (DIFLUCAN) 100 MG tablet Take 100 mg by mouth once a week.   3  . ibandronate (BONIVA) 150 MG tablet Take 150 mg by mouth every 30 (thirty) days.   0  . medroxyPROGESTERone (PROVERA) 5 MG tablet TAKE 1 TABLET BY MOUTH EVERY DAY FOR 10 DAYS OF EACH MONTH  3  . nitroGLYCERIN (NITROSTAT) 0.4 MG SL tablet Place 1 tablet (0.4 mg total) under the tongue every 5 (five) minutes as needed for chest pain (for three doses.). 25 tablet 5  . ofloxacin (OCUFLOX) 0.3 % ophthalmic solution PLACE 1 DROP INTO RIGHT EYE 4 TIMES A DAY STARTING 2 DAYS PRIOR TO SURGERY  0  . TRAVATAN Z 0.004 % SOLN ophthalmic solution INSTILL ONE DROP INTO EACH EYE ONCE DAILY AT BEDTIME  11  . valACYclovir (VALTREX) 500 MG tablet Take 500 mg by mouth daily as needed (breakouts).   0  . valsartan (DIOVAN) 160 MG tablet Take 160 mg by mouth daily.   2   No current facility-administered medications for this visit.    Allergies:   Boniva; Codeine; Gluten meal; Lexapro; and Sulfa antibiotics    Social History:  The patient  reports that she has quit smoking. She does not have any smokeless tobacco history on file. She reports that she drinks about 1.2 oz of alcohol per week. She reports that she does not use illicit drugs.   Family History:  The patient's family history includes Heart failure in her mother; Hypertension in her mother and sister. There is no history of Stroke or Heart attack.    ROS:   Please see the history of present illness.   Review of Systems  All other systems reviewed and are negative.    PHYSICAL EXAM: VS:  BP 178/80 mmHg  Pulse 44  Ht 5\' 4"  (1.626 m)  Wt 139 lb (63.05 kg)  BMI 23.85 kg/m2  SpO2 99%    Wt Readings from Last 3 Encounters:  04/03/15 139 lb (63.05 kg)  03/10/15 138 lb (62.596 kg)  03/07/15 138 lb (62.596 kg)     GEN: Well nourished, well developed, in no acute distress HEENT: normal Neck: no JVD,   no masses Cardiac:  Normal S1/S2, RRR; no  murmur,  no rubs or gallops, no edema  right wrist without hematoma or mass  Respiratory:  clear to auscultation bilaterally, no wheezing, rhonchi or rales. GI: soft, nontender, nondistended, + BS MS: no deformity or atrophy Skin: warm and dry  Neuro:  CNs II-XII intact, Strength and sensation are intact Psych: Normal affect   EKG:  EKG is ordered today.  It demonstrates:  Sinus brady, HR 44, normal axis, no ST changes.   Recent Labs: 03/07/2015: BUN 16; Creatinine, Ser 0.71; Hemoglobin 12.3; Platelets 209.0; Potassium 4.3; Sodium 134*    Lipid Panel No results found for: CHOL, TRIG, HDL, CHOLHDL, VLDL, LDLCALC, LDLDIRECT 07/12/2014:  TC 197, HDL 102, LDL 73   ASSESSMENT AND PLAN:  Abnormal finding on cardiovascular stress test:  Recent Myoview that was  done in response to an abnormal ETT demonstrated apical anterior as well as inferolateral and basal anterolateral ischemia.  ETT was originally set up b/c of interscapular back pain while hiking.  Her LHC demonstrated no significant CAD.  This was felt to represent a false + myoview.  We reviewed the findings on her heart cath.  I have given her a copy of her cardiac cath report, stress test report and her echo report.  She can FU with cardiology PRN.  Essential hypertension:  BP uncontrolled. She just returned forma trip out of town and forgot her Diovan.  She just started it back last night.  She will keep an eye on her BP and FU with her PCP if needed.  Bradycardia:  Asymptomatic.    Medication Changes: Current medicines are reviewed at length with the patient today.  Concerns regarding medicines are as outlined above.  The following changes have been made:   Discontinued Medications   KETOCONAZOLE (NIZORAL) 2 % CREAM    APPLY TO AFFECTED AREA TWICE A DAY   Modified Medications   No medications on file   New Prescriptions   No medications on file    Labs/ tests ordered today include:   Orders Placed This Encounter    Procedures  . EKG 12-Lead     Disposition:   FU with Dr. Cristopher Peru PRN.    Signed, Versie Starks, MHS 04/03/2015 5:48 PM    [facility] [address], [place], [place]  [address] Phone: [phone]; Fax: [phone]

## 2015-04-03 ENCOUNTER — Ambulatory Visit (INDEPENDENT_AMBULATORY_CARE_PROVIDER_SITE_OTHER): Payer: Medicare Other | Admitting: Physician Assistant

## 2015-04-03 ENCOUNTER — Encounter: Payer: Self-pay | Admitting: Physician Assistant

## 2015-04-03 VITALS — BP 178/80 | HR 44 | Ht 64.0 in | Wt 139.0 lb

## 2015-04-03 DIAGNOSIS — I10 Essential (primary) hypertension: Secondary | ICD-10-CM

## 2015-04-03 DIAGNOSIS — R001 Bradycardia, unspecified: Secondary | ICD-10-CM | POA: Diagnosis not present

## 2015-04-03 DIAGNOSIS — R9439 Abnormal result of other cardiovascular function study: Secondary | ICD-10-CM

## 2015-04-03 NOTE — Patient Instructions (Signed)
Medication Instructions:  Your physician recommends that you continue on your current medications as directed. Please refer to the Current Medication list given to you today.   Labwork: NONE  Testing/Procedures: NONE  Follow-Up: AS NEEDED  Any Other Special Instructions Will Be Listed Below (If Applicable).

## 2015-04-12 ENCOUNTER — Ambulatory Visit: Payer: Self-pay | Admitting: Physician Assistant

## 2015-08-03 IMAGING — MG MM DIAGNOSTIC UNILATERAL R
2 series · 2 of 2 positions shown · non-contrast
Comparison: Previous exams

CLINICAL DATA: Patient is post ultrasound-guided core needle biopsy
of a 1 cm mass over the 1 o'clock position of the right breast 5 cm
from the nipple.

EXAM:
DIAGNOSTIC RIGHT MAMMOGRAM POST ULTRASOUND BIOPSY

[R CC]
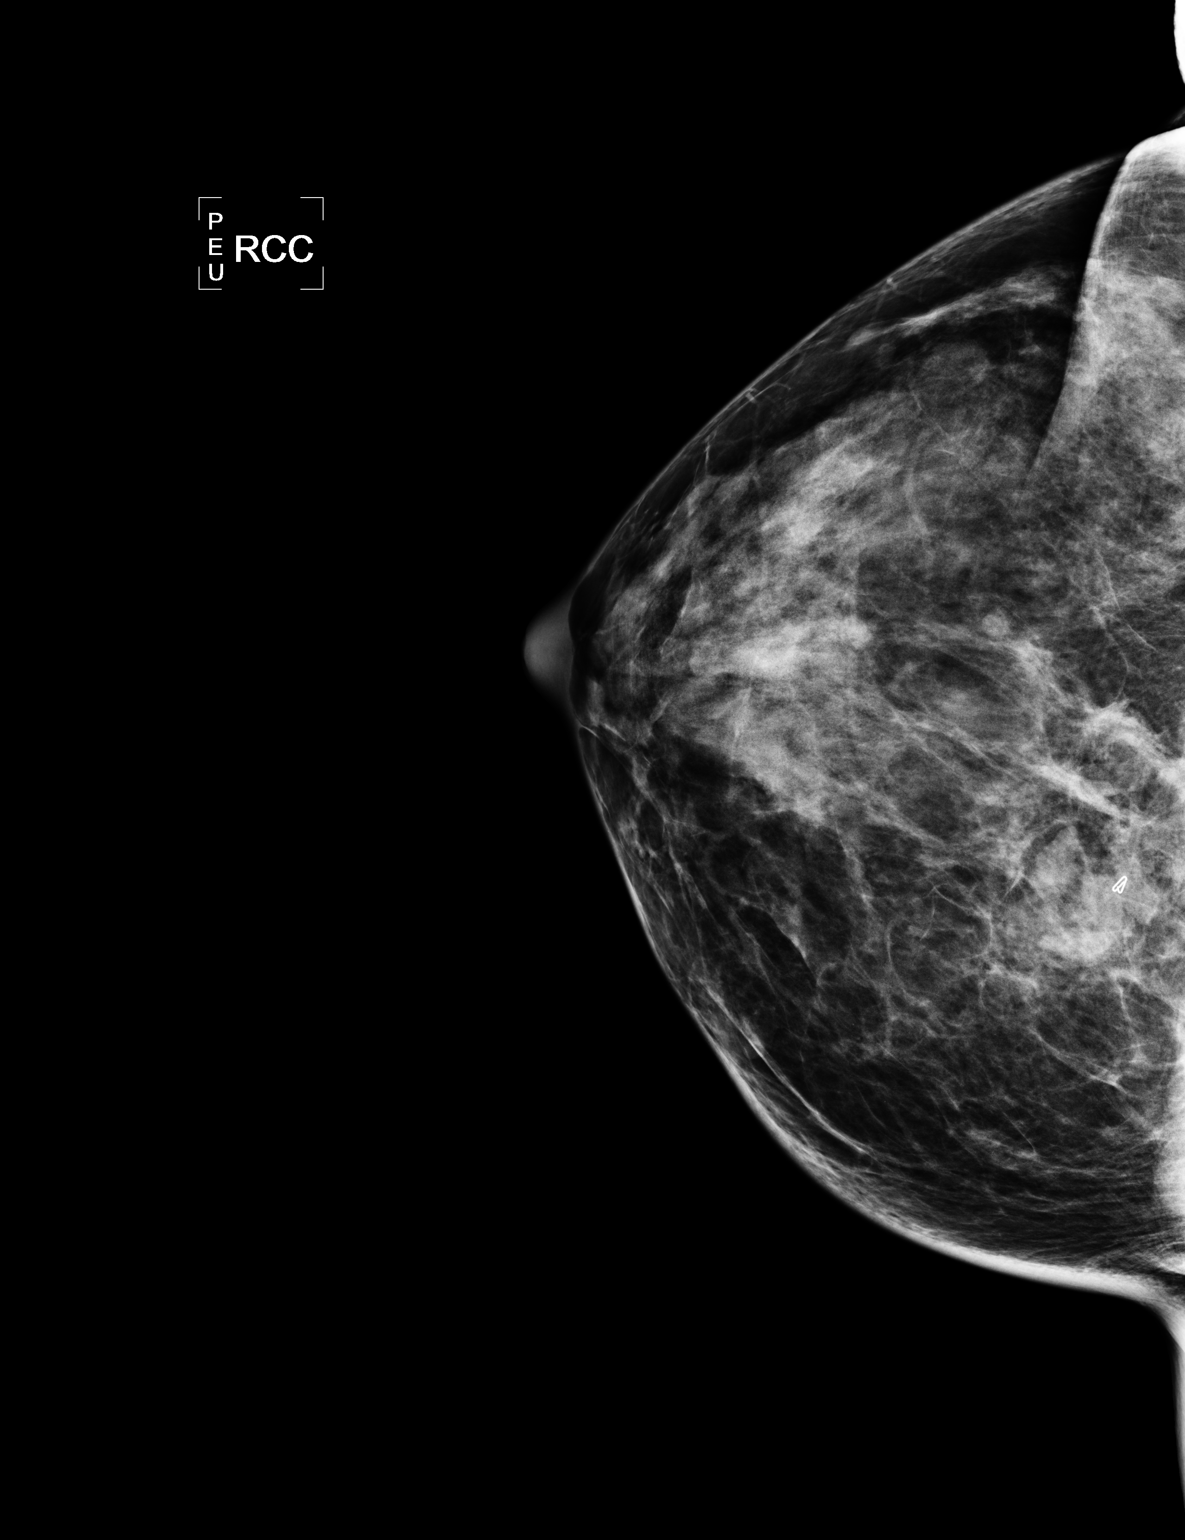

[R ML]
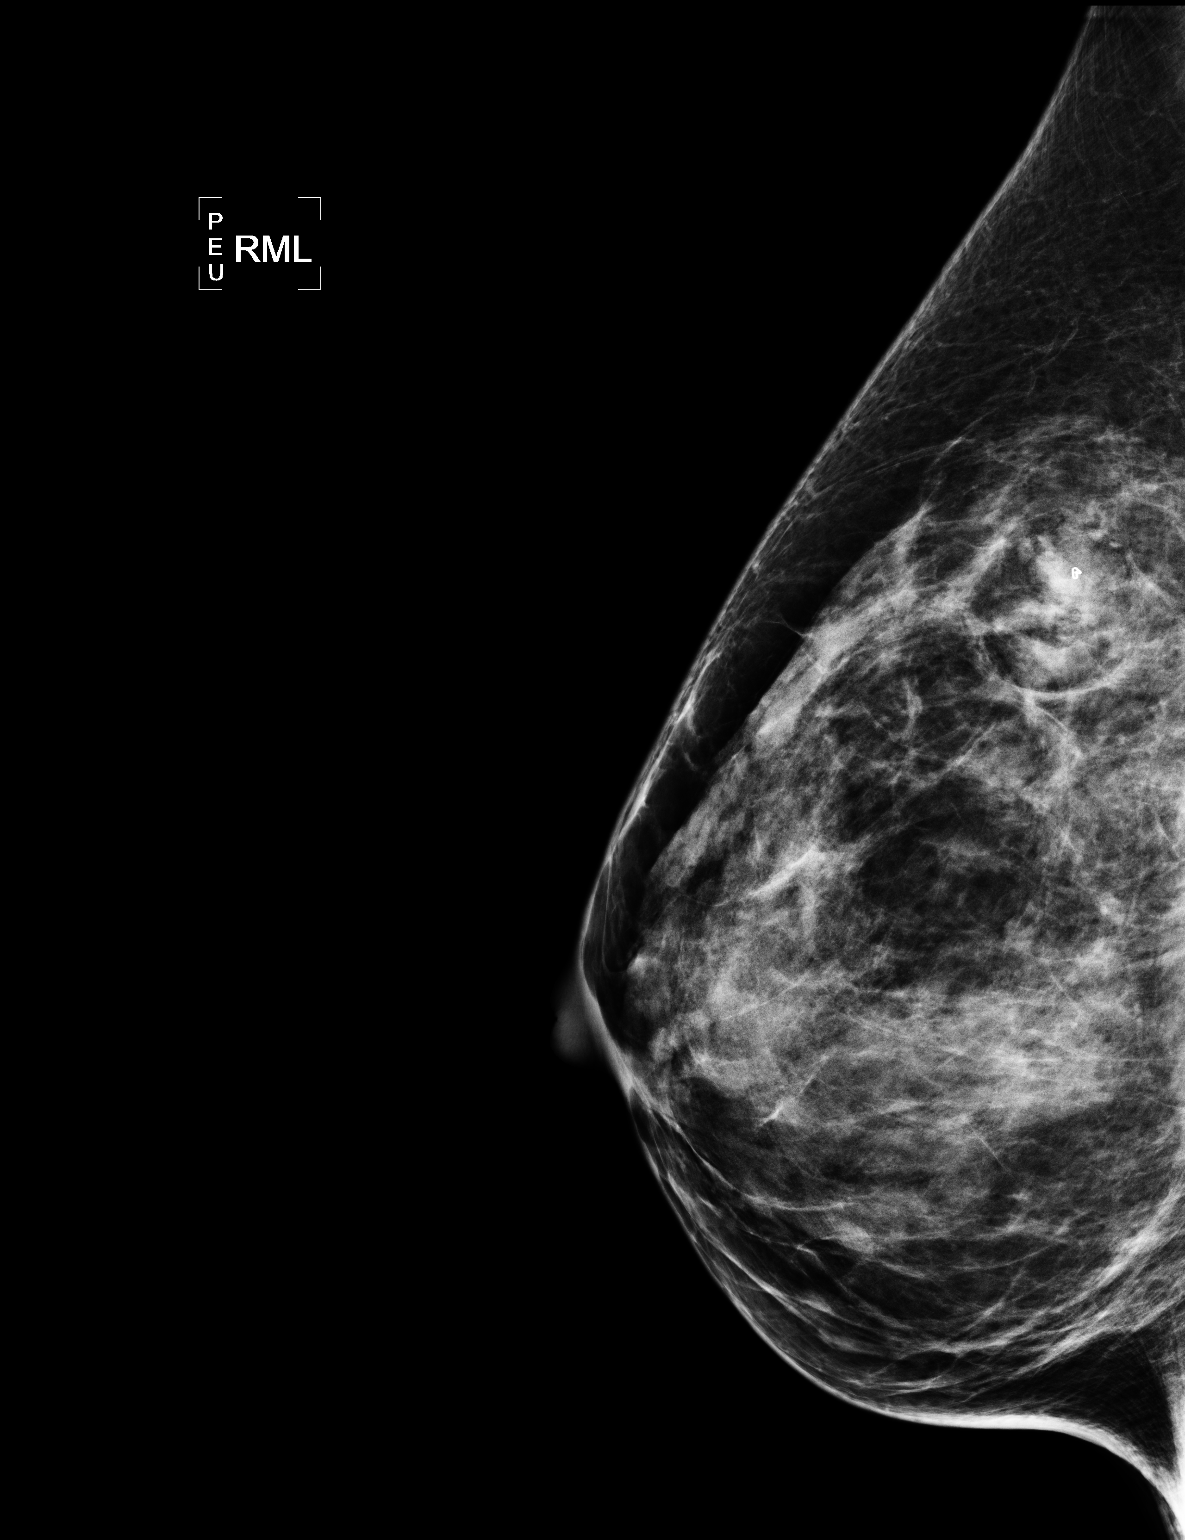

[2 of 2 positions shown; findings below may reference images not displayed]

FINDINGS: Mammographic images were obtained following ultrasound guided biopsy
of the targeted mass at the 1 o'clock position. Examination
demonstrates satisfactory placement of a heart shaped metallic clip
over the biopsied mass in the upper inner right breast. Small
hematoma is noted at the biopsy site.
IMPRESSION: Satisfactory clip placement post ultrasound-guided core needle
biopsy of a right breast mass.

Final Assessment: Post Procedure Mammograms for Marker Placement

## 2015-09-19 ENCOUNTER — Other Ambulatory Visit: Payer: Self-pay | Admitting: Gastroenterology

## 2016-06-07 ENCOUNTER — Other Ambulatory Visit: Payer: Self-pay | Admitting: Geriatric Medicine

## 2016-06-07 DIAGNOSIS — Z1231 Encounter for screening mammogram for malignant neoplasm of breast: Secondary | ICD-10-CM

## 2016-06-19 ENCOUNTER — Ambulatory Visit
Admission: RE | Admit: 2016-06-19 | Discharge: 2016-06-19 | Disposition: A | Discharge status: Home / Self Care | Payer: Medicare Other | Source: Ambulatory Visit | Attending: Geriatric Medicine | Admitting: Geriatric Medicine

## 2016-06-19 DIAGNOSIS — Z1231 Encounter for screening mammogram for malignant neoplasm of breast: Secondary | ICD-10-CM

## 2016-09-12 DIAGNOSIS — M8588 Other specified disorders of bone density and structure, other site: Secondary | ICD-10-CM | POA: Diagnosis not present

## 2016-10-30 DIAGNOSIS — H401431 Capsular glaucoma with pseudoexfoliation of lens, bilateral, mild stage: Secondary | ICD-10-CM | POA: Diagnosis not present

## 2016-10-30 DIAGNOSIS — H353132 Nonexudative age-related macular degeneration, bilateral, intermediate dry stage: Secondary | ICD-10-CM | POA: Diagnosis not present

## 2016-11-27 DIAGNOSIS — H401431 Capsular glaucoma with pseudoexfoliation of lens, bilateral, mild stage: Secondary | ICD-10-CM | POA: Diagnosis not present

## 2017-01-08 DIAGNOSIS — N950 Postmenopausal bleeding: Secondary | ICD-10-CM | POA: Diagnosis not present

## 2017-01-08 DIAGNOSIS — E222 Syndrome of inappropriate secretion of antidiuretic hormone: Secondary | ICD-10-CM | POA: Diagnosis not present

## 2017-01-08 DIAGNOSIS — R5383 Other fatigue: Secondary | ICD-10-CM | POA: Diagnosis not present

## 2017-01-08 DIAGNOSIS — Z79899 Other long term (current) drug therapy: Secondary | ICD-10-CM | POA: Diagnosis not present

## 2017-01-08 DIAGNOSIS — I10 Essential (primary) hypertension: Secondary | ICD-10-CM | POA: Diagnosis not present

## 2017-02-10 DIAGNOSIS — N950 Postmenopausal bleeding: Secondary | ICD-10-CM | POA: Diagnosis not present

## 2017-02-10 DIAGNOSIS — Z79890 Hormone replacement therapy (postmenopausal): Secondary | ICD-10-CM | POA: Diagnosis not present

## 2017-02-13 DIAGNOSIS — N950 Postmenopausal bleeding: Secondary | ICD-10-CM | POA: Diagnosis not present

## 2017-03-10 ENCOUNTER — Ambulatory Visit (INDEPENDENT_AMBULATORY_CARE_PROVIDER_SITE_OTHER): Payer: PPO | Admitting: Sports Medicine

## 2017-03-10 ENCOUNTER — Encounter: Payer: Self-pay | Admitting: Sports Medicine

## 2017-03-10 VITALS — BP 142/70 | Ht 64.0 in | Wt 135.0 lb

## 2017-03-10 DIAGNOSIS — M25561 Pain in right knee: Secondary | ICD-10-CM | POA: Diagnosis not present

## 2017-03-10 NOTE — Progress Notes (Signed)
   Subjective:    Patient ID: Amber Barr, female    DOB: 03-30-1946, 71 y.o.   MRN: [id number]  HPI chief complaint: right knee injury  Patient is an active lady who comes in with concerns for damage to right knee after a fall on [place]. She was fishing and slipped while wearing waders, hit her right knee on tree root during fall. She had ~30 minutes of pain to the area. Did not notice any swelling. Pain subsided and she was able to hike 2 miles the next day and do her usual activities since. Has an occasional ache in her knee. Noticed some swelling below knee ~1 week ago and has been icing this area and taking occasional ibuprofen prophylactically. Wants to ensure she did no damage and know if she should do anything to support the knee during future activities.  Past medical history reviewed Medications reviewed Allergies reviewed    Review of Systems- as above     Objective:   Physical Exam  Well nourished, well appearing, in NAD  Right knee: no effusion appreciated, mild swelling at pes anserine bursa. Minimal medial joint line tenderness. Negative Thessalys. Good stability. Neurovascularly intact. Limited US examination of right knee joint reveals mild suprapatellar effusion, otherwise unremarkable.       Assessment & Plan:   Right knee pain- secondary to contusion vs mild pes anserine bursitis  -reassurance provided -recommend icing and compression sleeve with activity -return if pain continues with activity or worsens

## 2017-07-08 DIAGNOSIS — H401431 Capsular glaucoma with pseudoexfoliation of lens, bilateral, mild stage: Secondary | ICD-10-CM | POA: Diagnosis not present

## 2017-07-08 DIAGNOSIS — Z961 Presence of intraocular lens: Secondary | ICD-10-CM | POA: Diagnosis not present

## 2017-08-05 DIAGNOSIS — I10 Essential (primary) hypertension: Secondary | ICD-10-CM | POA: Diagnosis not present

## 2017-08-05 DIAGNOSIS — F3340 Major depressive disorder, recurrent, in remission, unspecified: Secondary | ICD-10-CM | POA: Diagnosis not present

## 2017-08-05 DIAGNOSIS — Z0000 Encounter for general adult medical examination without abnormal findings: Secondary | ICD-10-CM | POA: Diagnosis not present

## 2017-08-05 DIAGNOSIS — H6123 Impacted cerumen, bilateral: Secondary | ICD-10-CM | POA: Diagnosis not present

## 2017-08-05 DIAGNOSIS — Z79899 Other long term (current) drug therapy: Secondary | ICD-10-CM | POA: Diagnosis not present

## 2017-08-05 DIAGNOSIS — E222 Syndrome of inappropriate secretion of antidiuretic hormone: Secondary | ICD-10-CM | POA: Diagnosis not present

## 2017-08-12 DIAGNOSIS — D2272 Melanocytic nevi of left lower limb, including hip: Secondary | ICD-10-CM | POA: Diagnosis not present

## 2017-08-12 DIAGNOSIS — T148XXA Other injury of unspecified body region, initial encounter: Secondary | ICD-10-CM | POA: Diagnosis not present

## 2017-08-12 DIAGNOSIS — L570 Actinic keratosis: Secondary | ICD-10-CM | POA: Diagnosis not present

## 2017-08-12 DIAGNOSIS — D225 Melanocytic nevi of trunk: Secondary | ICD-10-CM | POA: Diagnosis not present

## 2017-08-12 DIAGNOSIS — Z85828 Personal history of other malignant neoplasm of skin: Secondary | ICD-10-CM | POA: Diagnosis not present

## 2017-08-12 DIAGNOSIS — D2372 Other benign neoplasm of skin of left lower limb, including hip: Secondary | ICD-10-CM | POA: Diagnosis not present

## 2017-10-02 ENCOUNTER — Other Ambulatory Visit: Payer: Self-pay | Admitting: Geriatric Medicine

## 2017-10-02 DIAGNOSIS — Z139 Encounter for screening, unspecified: Secondary | ICD-10-CM

## 2017-10-21 ENCOUNTER — Ambulatory Visit
Admission: RE | Admit: 2017-10-21 | Discharge: 2017-10-21 | Disposition: A | Discharge status: Home / Self Care | Payer: PPO | Source: Ambulatory Visit | Attending: Geriatric Medicine | Admitting: Geriatric Medicine

## 2017-10-21 DIAGNOSIS — Z1231 Encounter for screening mammogram for malignant neoplasm of breast: Secondary | ICD-10-CM | POA: Diagnosis not present

## 2017-10-21 DIAGNOSIS — Z139 Encounter for screening, unspecified: Secondary | ICD-10-CM

## 2018-01-12 DIAGNOSIS — H40013 Open angle with borderline findings, low risk, bilateral: Secondary | ICD-10-CM | POA: Diagnosis not present

## 2018-01-12 DIAGNOSIS — H524 Presbyopia: Secondary | ICD-10-CM | POA: Diagnosis not present

## 2018-01-12 DIAGNOSIS — Z961 Presence of intraocular lens: Secondary | ICD-10-CM | POA: Diagnosis not present

## 2018-02-03 DIAGNOSIS — Z79899 Other long term (current) drug therapy: Secondary | ICD-10-CM | POA: Diagnosis not present

## 2018-02-03 DIAGNOSIS — I10 Essential (primary) hypertension: Secondary | ICD-10-CM | POA: Diagnosis not present

## 2018-02-03 DIAGNOSIS — R002 Palpitations: Secondary | ICD-10-CM | POA: Diagnosis not present

## 2018-06-03 DIAGNOSIS — L570 Actinic keratosis: Secondary | ICD-10-CM | POA: Diagnosis not present

## 2018-06-03 DIAGNOSIS — D2372 Other benign neoplasm of skin of left lower limb, including hip: Secondary | ICD-10-CM | POA: Diagnosis not present

## 2018-06-03 DIAGNOSIS — Z23 Encounter for immunization: Secondary | ICD-10-CM | POA: Diagnosis not present

## 2018-06-03 DIAGNOSIS — D2272 Melanocytic nevi of left lower limb, including hip: Secondary | ICD-10-CM | POA: Diagnosis not present

## 2018-06-03 DIAGNOSIS — B078 Other viral warts: Secondary | ICD-10-CM | POA: Diagnosis not present

## 2018-06-03 DIAGNOSIS — D225 Melanocytic nevi of trunk: Secondary | ICD-10-CM | POA: Diagnosis not present

## 2018-06-03 DIAGNOSIS — Z85828 Personal history of other malignant neoplasm of skin: Secondary | ICD-10-CM | POA: Diagnosis not present

## 2018-06-03 DIAGNOSIS — L821 Other seborrheic keratosis: Secondary | ICD-10-CM | POA: Diagnosis not present

## 2018-07-22 DIAGNOSIS — H353131 Nonexudative age-related macular degeneration, bilateral, early dry stage: Secondary | ICD-10-CM | POA: Diagnosis not present

## 2018-07-22 DIAGNOSIS — H401431 Capsular glaucoma with pseudoexfoliation of lens, bilateral, mild stage: Secondary | ICD-10-CM | POA: Diagnosis not present

## 2018-08-12 DIAGNOSIS — Z79899 Other long term (current) drug therapy: Secondary | ICD-10-CM | POA: Diagnosis not present

## 2018-08-12 DIAGNOSIS — K900 Celiac disease: Secondary | ICD-10-CM | POA: Diagnosis not present

## 2018-08-12 DIAGNOSIS — M810 Age-related osteoporosis without current pathological fracture: Secondary | ICD-10-CM | POA: Diagnosis not present

## 2018-08-12 DIAGNOSIS — E222 Syndrome of inappropriate secretion of antidiuretic hormone: Secondary | ICD-10-CM | POA: Diagnosis not present

## 2018-08-12 DIAGNOSIS — F3340 Major depressive disorder, recurrent, in remission, unspecified: Secondary | ICD-10-CM | POA: Diagnosis not present

## 2018-08-12 DIAGNOSIS — I10 Essential (primary) hypertension: Secondary | ICD-10-CM | POA: Diagnosis not present

## 2018-08-12 DIAGNOSIS — Z0000 Encounter for general adult medical examination without abnormal findings: Secondary | ICD-10-CM | POA: Diagnosis not present

## 2018-08-12 DIAGNOSIS — R002 Palpitations: Secondary | ICD-10-CM | POA: Diagnosis not present

## 2018-09-08 DIAGNOSIS — H401431 Capsular glaucoma with pseudoexfoliation of lens, bilateral, mild stage: Secondary | ICD-10-CM | POA: Diagnosis not present

## 2018-09-08 DIAGNOSIS — R002 Palpitations: Secondary | ICD-10-CM | POA: Diagnosis not present

## 2018-09-18 ENCOUNTER — Other Ambulatory Visit: Payer: Self-pay | Admitting: Geriatric Medicine

## 2018-09-18 DIAGNOSIS — Z1231 Encounter for screening mammogram for malignant neoplasm of breast: Secondary | ICD-10-CM

## 2018-11-16 ENCOUNTER — Ambulatory Visit
Admission: RE | Admit: 2018-11-16 | Discharge: 2018-11-16 | Disposition: A | Discharge status: Home / Self Care | Payer: PPO | Source: Ambulatory Visit | Attending: Geriatric Medicine | Admitting: Geriatric Medicine

## 2018-11-16 ENCOUNTER — Other Ambulatory Visit: Payer: Self-pay

## 2018-11-16 DIAGNOSIS — Z1231 Encounter for screening mammogram for malignant neoplasm of breast: Secondary | ICD-10-CM

## 2019-01-06 DIAGNOSIS — H401431 Capsular glaucoma with pseudoexfoliation of lens, bilateral, mild stage: Secondary | ICD-10-CM | POA: Diagnosis not present

## 2019-01-06 DIAGNOSIS — H524 Presbyopia: Secondary | ICD-10-CM | POA: Diagnosis not present

## 2019-02-09 DIAGNOSIS — E222 Syndrome of inappropriate secretion of antidiuretic hormone: Secondary | ICD-10-CM | POA: Diagnosis not present

## 2019-02-09 DIAGNOSIS — I10 Essential (primary) hypertension: Secondary | ICD-10-CM | POA: Diagnosis not present
